# Patient Record
Sex: Male | Born: 1990 | Race: White | Hispanic: No | Marital: Single | Smoking: Never smoker
Health system: Southern US, Community
[De-identification: ages and names within clinical notes are randomized; demographics above are authoritative.]

## PROBLEM LIST (undated history)

## (undated) HISTORY — PX: SHOULDER ARTHROSCOPY: SHX128

---
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
## 2012-08-25 ENCOUNTER — Emergency Department: Payer: Self-pay | Admitting: Emergency Medicine

## 2013-11-12 ENCOUNTER — Encounter: Payer: Self-pay | Admitting: Podiatry

## 2013-11-12 ENCOUNTER — Ambulatory Visit (INDEPENDENT_AMBULATORY_CARE_PROVIDER_SITE_OTHER): Payer: BC Managed Care – PPO | Admitting: Podiatry

## 2013-11-12 VITALS — BP 139/83 | HR 59 | Resp 16 | Ht 71.0 in | Wt 186.0 lb

## 2013-11-12 DIAGNOSIS — L600 Ingrowing nail: Secondary | ICD-10-CM

## 2013-11-12 NOTE — Progress Notes (Signed)
   Subjective:    Patient ID: Nicholas Chase, male    DOB: [birth year], 23 y.o.   MRN: [id number]  HPI Comments: "I have an ingrown"  Patient c/o tender 1st toe left, lateral border, since May 28th. The area is red, swollen, and has been draining. He is currently on Augmentin and has been soaking and cleaning with peroxide. Some better.  Toe Pain       Review of Systems  Skin: Positive for rash.  All other systems reviewed and are negative.      Objective:   Physical Exam        Assessment & Plan:

## 2013-11-12 NOTE — Patient Instructions (Addendum)

## 2013-11-13 ENCOUNTER — Telehealth: Payer: Self-pay | Admitting: *Deleted

## 2013-11-13 NOTE — Progress Notes (Signed)
Subjective:     Patient ID: Nicholas Chase, male   DOB: 15-Mar-1991, 23 y.o.   MRN: [id number]  Toe Pain    patient presents with mother stating that I haven't ingrown toenail it's been really bothering me and I tried to trim it soak it without relief and it had some drainage   Review of Systems  All other systems reviewed and are negative.      Objective:   Physical Exam  Nursing note and vitals reviewed. Constitutional: He is oriented to person, place, and time.  Cardiovascular: Intact distal pulses.   Musculoskeletal: Normal range of motion.  Neurological: He is oriented to person, place, and time.  Skin: Skin is warm.   neurovascular status is found to be intact with range of motion adequate subtalar midtarsal joint and muscle strength within normal limits. Patient's found to have on the left hallux lateral border pain when pressed with an incurvated quarter that has some slight distal redness but no drainage as patient currently is on antibiotic from family physician     Assessment:     Ingrown toenail left hallux with no indications of current infection    Plan:     H&P and condition explained the patient. I recommended removal of the corner and I explained the procedure and risk. Patient wants surgery and today I infiltrated 60 mg Xylocaine Marcaine mixture remove the lateral quarter exposed matrix and applied phenol 3 applications 30 seconds followed by alcohol lavaged and sterile dressing. Instructed on soaks and reappoint

## 2013-11-13 NOTE — Telephone Encounter (Signed)
I had an ingrown toenail done yesterday.  You prescribed Triple Antibiotic Ointment.  I'm allergic to sulfate.  This kit has it in there.  Is there something else I can use to treat it?  Patient called again.  I informed him to use betadine ointment instead of the triple antibiotic ointment.  He asked if that comes in the kit.  I told him no.  He said so I have to go out and buy some.  I told him yes.

## 2016-06-28 ENCOUNTER — Encounter: Payer: Self-pay | Admitting: Emergency Medicine

## 2016-06-28 ENCOUNTER — Emergency Department
Admission: EM | Admit: 2016-06-28 | Discharge: 2016-06-28 | Disposition: A | Discharge status: Home / Self Care | Payer: Self-pay | Attending: Emergency Medicine | Admitting: Emergency Medicine

## 2016-06-28 ENCOUNTER — Emergency Department: Payer: Self-pay

## 2016-06-28 DIAGNOSIS — F1290 Cannabis use, unspecified, uncomplicated: Secondary | ICD-10-CM | POA: Insufficient documentation

## 2016-06-28 DIAGNOSIS — N201 Calculus of ureter: Secondary | ICD-10-CM | POA: Insufficient documentation

## 2016-06-28 DIAGNOSIS — R109 Unspecified abdominal pain: Secondary | ICD-10-CM

## 2016-06-28 LAB — URINALYSIS, COMPLETE (UACMP) WITH MICROSCOPIC
BACTERIA UA: NONE SEEN
BILIRUBIN URINE: NEGATIVE
Glucose, UA: 50 mg/dL — AB
Ketones, ur: 20 mg/dL — AB
Leukocytes, UA: NEGATIVE
NITRITE: NEGATIVE
PROTEIN: NEGATIVE mg/dL
Specific Gravity, Urine: 1.024 (ref 1.005–1.030)
Squamous Epithelial / LPF: NONE SEEN
pH: 5 (ref 5.0–8.0)

## 2016-06-28 LAB — CBC
HEMATOCRIT: 40.8 % (ref 40.0–52.0)
HEMOGLOBIN: 14.6 g/dL (ref 13.0–18.0)
MCH: 31.2 pg (ref 26.0–34.0)
MCHC: 35.9 g/dL (ref 32.0–36.0)
MCV: 86.9 fL (ref 80.0–100.0)
Platelets: 251 10*3/uL (ref 150–440)
RBC: 4.7 MIL/uL (ref 4.40–5.90)
RDW: 12.8 % (ref 11.5–14.5)
WBC: 10.2 10*3/uL (ref 3.8–10.6)

## 2016-06-28 LAB — COMPREHENSIVE METABOLIC PANEL
ALBUMIN: 4.7 g/dL (ref 3.5–5.0)
ALT: 21 U/L (ref 17–63)
ANION GAP: 10 (ref 5–15)
AST: 27 U/L (ref 15–41)
Alkaline Phosphatase: 63 U/L (ref 38–126)
BILIRUBIN TOTAL: 1.4 mg/dL — AB (ref 0.3–1.2)
BUN: 21 mg/dL — ABNORMAL HIGH (ref 6–20)
CHLORIDE: 104 mmol/L (ref 101–111)
CO2: 22 mmol/L (ref 22–32)
Calcium: 9.5 mg/dL (ref 8.9–10.3)
Creatinine, Ser: 1.17 mg/dL (ref 0.61–1.24)
GFR calc Af Amer: >60 mL/min (ref >60)
GFR calc non Af Amer: >60 mL/min (ref >60)
GLUCOSE: 141 mg/dL — AB (ref 65–99)
POTASSIUM: 3.6 mmol/L (ref 3.5–5.1)
SODIUM: 136 mmol/L (ref 135–145)
TOTAL PROTEIN: 7.8 g/dL (ref 6.5–8.1)

## 2016-06-28 LAB — LIPASE, BLOOD: LIPASE: 17 U/L (ref 11–51)

## 2016-06-28 MED ORDER — HYDROCODONE-ACETAMINOPHEN 5-325 MG PO TABS: 1.0000 | ORAL_TABLET | ORAL | 0 refills | Status: DC | PRN

## 2016-06-28 MED ORDER — NAPROXEN 500 MG PO TABS: 500.0000 mg | ORAL_TABLET | Freq: Two times a day (BID) | ORAL | 2 refills | Status: DC

## 2016-06-28 MED ORDER — HYDROMORPHONE HCL 1 MG/ML IJ SOLN
0.5000 mg | Freq: Once | INTRAMUSCULAR | Status: AC
Start: 2016-06-28 — End: 2016-06-28
  Administered 2016-06-28: 0.5 mg via INTRAVENOUS
  Filled 2016-06-28: qty 1

## 2016-06-28 MED ORDER — HYDROMORPHONE HCL 1 MG/ML IJ SOLN
0.5000 mg | Freq: Once | INTRAMUSCULAR | Status: AC
  Administered 2016-06-28: 0.5 mg via INTRAVENOUS

## 2016-06-28 MED ORDER — HYDROMORPHONE HCL 1 MG/ML IJ SOLN
INTRAMUSCULAR | Status: AC
  Administered 2016-06-28: 0.5 mg via INTRAVENOUS
  Filled 2016-06-28: qty 1

## 2016-06-28 MED ORDER — KETOROLAC TROMETHAMINE 30 MG/ML IJ SOLN
INTRAMUSCULAR | Status: AC
  Administered 2016-06-28: 30 mg via INTRAVENOUS
  Filled 2016-06-28: qty 1

## 2016-06-28 MED ORDER — ONDANSETRON HCL 4 MG PO TABS: 4.0000 mg | ORAL_TABLET | Freq: Three times a day (TID) | ORAL | 1 refills | Status: DC | PRN

## 2016-06-28 MED ORDER — ONDANSETRON HCL 4 MG/2ML IJ SOLN
INTRAMUSCULAR | Status: AC
  Administered 2016-06-28: 4 mg via INTRAVENOUS
  Filled 2016-06-28: qty 2

## 2016-06-28 MED ORDER — ONDANSETRON HCL 4 MG/2ML IJ SOLN
4.0000 mg | Freq: Once | INTRAMUSCULAR | Status: AC | PRN
  Administered 2016-06-28: 4 mg via INTRAVENOUS

## 2016-06-28 MED ORDER — ONDANSETRON HCL 4 MG PO TABS: 4.0000 mg | ORAL_TABLET | Freq: Every day | ORAL | 1 refills | Status: DC | PRN

## 2016-06-28 MED ORDER — KETOROLAC TROMETHAMINE 30 MG/ML IJ SOLN
30.0000 mg | Freq: Once | INTRAMUSCULAR | Status: AC
  Administered 2016-06-28: 30 mg via INTRAVENOUS

## 2016-06-28 MED ORDER — SODIUM CHLORIDE 0.9 % IV BOLUS (SEPSIS)
1000.0000 mL | Freq: Once | INTRAVENOUS | Status: AC
Start: 2016-06-28 — End: 2016-06-28
  Administered 2016-06-28: 1000 mL via INTRAVENOUS

## 2016-06-28 NOTE — ED Triage Notes (Signed)
Patient with complaint of right flank pain, nausea and vomiting about 2 hours ago.

## 2016-06-28 NOTE — ED Provider Notes (Signed)
[facility] Emergency Department Provider Note   ____________________________________________    I have reviewed the triage vital signs and the nursing notes.   HISTORY  Chief Complaint Flank Pain and Emesis     HPI Nicholas Chase is a 26 y.o. male who presents with complaints of right flank pain. Patient reports the pain is moderate to severe and cramping in nature. He denies fever. Mild nausea no vomiting. No history of the same. No history of abdominal surgery. Patient reports the pain started relatively abruptly overnight. Denies hematuria.   History reviewed. No pertinent past medical history.  There are no active problems to display for this patient.   Past Surgical History:  Procedure Laterality Date  . SHOULDER ARTHROSCOPY      Prior to Admission medications   Medication Sig Start Date End Date Taking? Authorizing Provider  amoxicillin-clavulanate (AUGMENTIN) 500-125 MG per tablet Take 1 tablet by mouth 3 (three) times daily.    Historical Provider, MD  HYDROcodone-acetaminophen (NORCO/VICODIN) 5-325 MG tablet Take 1 tablet by mouth every 4 (four) hours as needed for moderate pain. 06/28/16   Jene Everyobert Ladeana Laplant, MD  IBUPROFEN PO Take by mouth.    Historical Provider, MD  naproxen (NAPROSYN) 500 MG tablet Take 1 tablet (500 mg total) by mouth 2 (two) times daily with a meal. 06/28/16   Jene Everyobert Aziah Brostrom, MD  ondansetron (ZOFRAN) 4 MG tablet Take 1 tablet (4 mg total) by mouth every 8 (eight) hours as needed for nausea or vomiting. 06/28/16   Jene Everyobert Arcadia Gorgas, MD     Allergies Sulfa antibiotics  No family history on file.  Social History Social History  Substance Use Topics  . Smoking status: Never Smoker  . Smokeless tobacco: Never Used  . Alcohol use No    Review of Systems  Constitutional: No fever/chills   Cardiovascular: Denies chest pain. Respiratory: Denies shortness of breath. Gastrointestinal: As above   Genitourinary:  Negative for hematuria Musculoskeletal: Negative for back pain.    10-point ROS otherwise negative.  ____________________________________________   PHYSICAL EXAM:  VITAL SIGNS: ED Triage Vitals  Enc Vitals Group     BP 06/28/16 0526 134/74     Pulse Rate 06/28/16 0526 69     Resp 06/28/16 0526 18     Temp 06/28/16 0526 97.4 F (36.3 C)     Temp Source 06/28/16 0526 Oral     SpO2 06/28/16 0526 100 %     Weight 06/28/16 0526 175 lb (79.4 kg)     Height 06/28/16 0526 6' (1.829 m)     Head Circumference --      Peak Flow --      Pain Score 06/28/16 0527 8     Pain Loc --      Pain Edu? --      Excl. in GC? --     Constitutional: Alert and oriented. No acute distress. Pleasant and interactive Eyes: Conjunctivae are normal.   Mouth/Throat: Mucous membranes are moist.    Cardiovascular: Normal rate, regular rhythm. Grossly normal heart sounds.  Good peripheral circulation. Respiratory: Normal respiratory effort.  No retractions. Lungs CTAB. Gastrointestinal: Right lower quadrant tenderness. No distention.  No CVA tenderness.  Musculoskeletal: No lower extremity tenderness nor edema.  Warm and well perfused Neurologic:  Normal speech and language. No gross focal neurologic deficits are appreciated.  Skin:  Skin is warm, dry and intact. No rash noted. Psychiatric: Mood and affect are normal. Speech and behavior are normal.  ____________________________________________   LABS (all labs ordered are listed, but only abnormal results are displayed)  Labs Reviewed  COMPREHENSIVE METABOLIC PANEL - Abnormal; Notable for the following:       Result Value   Glucose, Bld 141 (*)    BUN 21 (*)    Total Bilirubin 1.4 (*)    All other components within normal limits  URINALYSIS, COMPLETE (UACMP) WITH MICROSCOPIC - Abnormal; Notable for the following:    Color, Urine YELLOW (*)    APPearance CLEAR (*)    Glucose, UA 50 (*)    Hgb urine dipstick LARGE (*)    Ketones, ur 20 (*)     All other components within normal limits  CBC  LIPASE, BLOOD   ____________________________________________  EKG  None ____________________________________________  RADIOLOGY  CT renal stone study Demonstrates right 3 mm UVJ stone ____________________________________________   PROCEDURES  Procedure(s) performed: No    Critical Care performed: No ____________________________________________   INITIAL IMPRESSION / ASSESSMENT AND PLAN / ED COURSE  Pertinent labs & imaging results that were available during my care of the patient were reviewed by me and considered in my medical decision making (see chart for details).  Patient presented with complaints of flank pain. Urinalysis pending. Suspicious for ureterolithiasis although appendicitis is also on the differential. Lab work is reassuring. IV analgesics  Clinical Course as of Jun 28 1106  Mon Jun 28, 2016  0849 3 mm uvj stone, pending urinalysis   [RK]    Clinical Course User Index [RK] Jene Every, MD  Patient's pain is relieved after Toradol. Urinalysis is reassuring. We will treat with pain medications and discharge the patient ____________________________________________   FINAL CLINICAL IMPRESSION(S) / ED DIAGNOSES  Final diagnoses:  Right flank pain  Ureterolithiasis      NEW MEDICATIONS STARTED DURING THIS VISIT:  New Prescriptions   HYDROCODONE-ACETAMINOPHEN (NORCO/VICODIN) 5-325 MG TABLET    Take 1 tablet by mouth every 4 (four) hours as needed for moderate pain.   NAPROXEN (NAPROSYN) 500 MG TABLET    Take 1 tablet (500 mg total) by mouth 2 (two) times daily with a meal.   ONDANSETRON (ZOFRAN) 4 MG TABLET    Take 1 tablet (4 mg total) by mouth every 8 (eight) hours as needed for nausea or vomiting.     Note:  This document was prepared using Dragon voice recognition software and may include unintentional dictation errors.    Jene Every, MD 06/28/16 [phone]

## 2018-02-04 DIAGNOSIS — R112 Nausea with vomiting, unspecified: Secondary | ICD-10-CM | POA: Diagnosis not present

## 2018-02-04 DIAGNOSIS — B349 Viral infection, unspecified: Secondary | ICD-10-CM | POA: Diagnosis not present

## 2018-02-05 ENCOUNTER — Encounter: Payer: Self-pay | Admitting: Emergency Medicine

## 2018-02-05 ENCOUNTER — Emergency Department: Payer: BLUE CROSS/BLUE SHIELD

## 2018-02-05 ENCOUNTER — Emergency Department
Admission: EM | Admit: 2018-02-05 | Discharge: 2018-02-06 | Disposition: A | Discharge status: Home / Self Care | Payer: BLUE CROSS/BLUE SHIELD | Attending: Emergency Medicine | Admitting: Emergency Medicine

## 2018-02-05 DIAGNOSIS — Z79899 Other long term (current) drug therapy: Secondary | ICD-10-CM | POA: Insufficient documentation

## 2018-02-05 DIAGNOSIS — R51 Headache: Secondary | ICD-10-CM | POA: Diagnosis not present

## 2018-02-05 DIAGNOSIS — J189 Pneumonia, unspecified organism: Secondary | ICD-10-CM | POA: Diagnosis not present

## 2018-02-05 DIAGNOSIS — R05 Cough: Secondary | ICD-10-CM | POA: Diagnosis not present

## 2018-02-05 LAB — CSF CELL COUNT WITH DIFFERENTIAL
EOS CSF: 0 %
EOS CSF: 0 %
LYMPHS CSF: 50 %
LYMPHS CSF: 73 %
MONOCYTE-MACROPHAGE-SPINAL FLUID: 27 %
MONOCYTE-MACROPHAGE-SPINAL FLUID: 50 %
OTHER CELLS CSF: 0
OTHER CELLS CSF: 0
RBC COUNT CSF: 0 /mm3 (ref 0–3)
RBC Count, CSF: 24 /mm3 — ABNORMAL HIGH (ref 0–3)
Segmented Neutrophils-CSF: 0 %
Segmented Neutrophils-CSF: 0 %
TUBE #: 1
TUBE #: 4
WBC, CSF: 2 /mm3 (ref 0–5)
WBC, CSF: 2 /mm3 (ref 0–5)

## 2018-02-05 LAB — COMPREHENSIVE METABOLIC PANEL
ALBUMIN: 3.9 g/dL (ref 3.5–5.0)
ALT: 29 U/L (ref 0–44)
AST: 32 U/L (ref 15–41)
Alkaline Phosphatase: 57 U/L (ref 38–126)
Anion gap: 7 (ref 5–15)
BILIRUBIN TOTAL: 1.6 mg/dL — AB (ref 0.3–1.2)
BUN: 13 mg/dL (ref 6–20)
CALCIUM: 9.2 mg/dL (ref 8.9–10.3)
CO2: 28 mmol/L (ref 22–32)
CREATININE: 1.07 mg/dL (ref 0.61–1.24)
Chloride: 103 mmol/L (ref 98–111)
GFR calc Af Amer: >60 mL/min (ref >60)
GFR calc non Af Amer: >60 mL/min (ref >60)
GLUCOSE: 114 mg/dL — AB (ref 70–99)
Potassium: 3.5 mmol/L (ref 3.5–5.1)
SODIUM: 138 mmol/L (ref 135–145)
Total Protein: 8 g/dL (ref 6.5–8.1)

## 2018-02-05 LAB — CBC WITH DIFFERENTIAL/PLATELET
BASOS PCT: 0 %
Basophils Absolute: 0 10*3/uL (ref 0–0.1)
EOS ABS: 0 10*3/uL (ref 0–0.7)
EOS PCT: 0 %
HCT: 39.4 % — ABNORMAL LOW (ref 40.0–52.0)
Hemoglobin: 14.1 g/dL (ref 13.0–18.0)
Lymphocytes Relative: 5 %
Lymphs Abs: 0.7 10*3/uL — ABNORMAL LOW (ref 1.0–3.6)
MCH: 31.8 pg (ref 26.0–34.0)
MCHC: 35.7 g/dL (ref 32.0–36.0)
MCV: 88.9 fL (ref 80.0–100.0)
MONO ABS: 0.3 10*3/uL (ref 0.2–1.0)
MONOS PCT: 3 %
NEUTROS ABS: 12.2 10*3/uL — AB (ref 1.4–6.5)
Neutrophils Relative %: 92 %
PLATELETS: 301 10*3/uL (ref 150–440)
RBC: 4.43 MIL/uL (ref 4.40–5.90)
RDW: 12 % (ref 11.5–14.5)
WBC: 13.3 10*3/uL — ABNORMAL HIGH (ref 3.8–10.6)

## 2018-02-05 LAB — MONONUCLEOSIS SCREEN: Mono Screen: NEGATIVE

## 2018-02-05 LAB — PROTEIN AND GLUCOSE, CSF
Glucose, CSF: 66 mg/dL (ref 40–70)
TOTAL PROTEIN, CSF: 20 mg/dL (ref 15–45)

## 2018-02-05 MED ORDER — SODIUM CHLORIDE 0.9 % IV BOLUS
1000.0000 mL | Freq: Once | INTRAVENOUS | Status: AC
  Administered 2018-02-05: 1000 mL via INTRAVENOUS

## 2018-02-05 MED ORDER — ONDANSETRON HCL 4 MG/2ML IJ SOLN
4.0000 mg | Freq: Once | INTRAMUSCULAR | Status: AC
  Administered 2018-02-05: 4 mg via INTRAVENOUS
  Filled 2018-02-05: qty 2

## 2018-02-05 MED ORDER — AZITHROMYCIN 500 MG PO TABS
500.0000 mg | ORAL_TABLET | Freq: Once | ORAL | Status: AC
  Administered 2018-02-06: 500 mg via ORAL
  Filled 2018-02-05: qty 1

## 2018-02-05 MED ORDER — LIDOCAINE HCL (PF) 1 % IJ SOLN
INTRAMUSCULAR | Status: AC
  Administered 2018-02-05: 5 mL
  Filled 2018-02-05: qty 5

## 2018-02-05 MED ORDER — AZITHROMYCIN 250 MG PO TABS: ORAL_TABLET | ORAL | 0 refills | Status: AC

## 2018-02-05 MED ORDER — CEFTRIAXONE SODIUM 2 G IJ SOLR
2.0000 g | Freq: Once | INTRAMUSCULAR | Status: AC
  Administered 2018-02-05: 2 g via INTRAVENOUS
  Filled 2018-02-05: qty 20

## 2018-02-05 MED ORDER — LIDOCAINE HCL (PF) 1 % IJ SOLN
5.0000 mL | Freq: Once | INTRAMUSCULAR | Status: AC
  Administered 2018-02-05: 5 mL
  Filled 2018-02-05 (×2): qty 5

## 2018-02-05 MED ORDER — VANCOMYCIN HCL 10 G IV SOLR
1500.0000 mg | Freq: Once | INTRAVENOUS | Status: DC
  Filled 2018-02-05: qty 1500

## 2018-02-05 MED ORDER — MORPHINE SULFATE (PF) 4 MG/ML IV SOLN
4.0000 mg | Freq: Once | INTRAVENOUS | Status: AC
  Administered 2018-02-05: 4 mg via INTRAVENOUS
  Filled 2018-02-05: qty 1

## 2018-02-05 NOTE — ED Provider Notes (Addendum)
[facility] Emergency Department Provider Note  ____________________________________________  Time seen: Approximately 9:49 PM  I have reviewed the triage vital signs and the nursing notes.   HISTORY  Chief Complaint Fever; Headache; and Neck Pain    HPI Nicholas Chase is a 27 y.o. male complaining of headache neck pain and fever for the past 3 to 4 days, gradual onset.  Also has sweats and chills.  Headache is generalized, worse with change in position and neck movement.  Feels somewhat similar to headaches he said in the past but much more severe than his ever had.  No recent illnesses.  No past medical history.  Pain is constant, waxing and waning, severe.  Worse with lights.  No alleviating factors.      History reviewed. No pertinent past medical history.   There are no active problems to display for this patient.    Past Surgical History:  Procedure Laterality Date  . SHOULDER ARTHROSCOPY       Prior to Admission medications   Medication Sig Start Date End Date Taking? Authorizing Provider  azithromycin (ZITHROMAX Z-PAK) 250 MG tablet Take 2 tablets (500 mg) on  Day 1,  followed by 1 tablet (250 mg) once daily on Days 2 through 5. 02/05/18   Sharman Cheek, MD     Allergies Sulfa antibiotics   No family history on file.  Social History Social History   Tobacco Use  . Smoking status: Never Smoker  . Smokeless tobacco: Never Used  Substance Use Topics  . Alcohol use: No  . Drug use: Yes    Types: Marijuana    Review of Systems  Constitutional: Positive fever and chills.  ENT:   No sore throat. No rhinorrhea. Cardiovascular:   No chest pain or syncope. Respiratory: Positive shortness of breath and nonproductive cough. Gastrointestinal:   Negative for abdominal pain, vomiting and diarrhea.  Musculoskeletal:   Negative for focal pain or swelling All other systems reviewed and are negative except as documented above in ROS  and HPI.  ____________________________________________   PHYSICAL EXAM:  VITAL SIGNS: ED Triage Vitals  Enc Vitals Group     BP 02/05/18 1950 (!) 144/83     Pulse Rate 02/05/18 1950 100     Resp 02/05/18 1950 (!) 30     Temp 02/05/18 1950 98.2 F (36.8 C)     Temp Source 02/05/18 1950 Oral     SpO2 02/05/18 1950 90 %     Weight 02/05/18 1953 205 lb (93 kg)     Height 02/05/18 1953 6' (1.829 m)     Head Circumference --      Peak Flow --      Pain Score 02/05/18 1953 7     Pain Loc --      Pain Edu? --      Excl. in GC? --     Vital signs reviewed, nursing assessments reviewed.   Constitutional:   Alert and oriented. Non-toxic appearance. Eyes:   Conjunctivae are normal. EOMI. PERRL.  Positive photophobia ENT      Head:   Normocephalic and atraumatic.      Nose:   No congestion/rhinnorhea.       Mouth/Throat:   MMM, no pharyngeal erythema. No peritonsillar mass.       Neck:   No meningismus. Full ROM. Hematological/Lymphatic/Immunilogical:   No cervical lymphadenopathy. Cardiovascular:   RRR. Symmetric bilateral radial and DP pulses.  No murmurs. Cap refill less than 2 seconds.  Respiratory:   Mild tachypnea.  Coarse breath sounds diffusely.  Inducible wheezing when coughing or exhaling quickly.  Good air entry in all lung fields.  Oxygen saturation about 94%. Gastrointestinal:   Soft and nontender. Non distended. There is no CVA tenderness.  No rebound, rigidity, or guarding. Musculoskeletal:   Normal range of motion in all extremities. No joint effusions.  No lower extremity tenderness.  No edema. Neurologic:   Normal speech and language.  Motor grossly intact. No nuchal rigidity Cranial nerves III through XII intact No acute focal neurologic deficits are appreciated.  Skin:    Skin is warm, dry and intact. No rash noted.  No petechiae, purpura, or bullae.  ____________________________________________    LABS (pertinent positives/negatives) (all labs ordered are  listed, but only abnormal results are displayed) Labs Reviewed  COMPREHENSIVE METABOLIC PANEL - Abnormal; Notable for the following components:      Result Value   Glucose, Bld 114 (*)    Total Bilirubin 1.6 (*)    All other components within normal limits  CBC WITH DIFFERENTIAL/PLATELET - Abnormal; Notable for the following components:   WBC 13.3 (*)    HCT 39.4 (*)    Neutro Abs 12.2 (*)    Lymphs Abs 0.7 (*)    All other components within normal limits  CSF CELL COUNT WITH DIFFERENTIAL - Abnormal; Notable for the following components:   RBC Count, CSF 24 (*)    All other components within normal limits  CSF CULTURE  MONONUCLEOSIS SCREEN  CSF CELL COUNT WITH DIFFERENTIAL  PROTEIN AND GLUCOSE, CSF  HERPES SIMPLEX VIRUS(HSV) DNA BY PCR   ____________________________________________   EKG    ____________________________________________    RADIOLOGY  Ct Head Wo Contrast  Result Date: 02/05/2018 CLINICAL DATA:  Headache and neck pain with fever EXAM: CT HEAD WITHOUT CONTRAST TECHNIQUE: Contiguous axial images were obtained from the base of the skull through the vertex without intravenous contrast. COMPARISON:  None. FINDINGS: BRAIN: The ventricles and sulci are normal. No intraparenchymal hemorrhage, mass effect nor midline shift. No acute large vascular territory infarcts. Grey-white matter distinction is maintained. No hydrocephalus. The basal ganglia are unremarkable. No abnormal extra-axial fluid collections. Basal cisterns are not effaced and midline. The brainstem and cerebellar hemispheres are without acute abnormalities. VASCULAR: No hyperdense vessel sign. No definite aneurysm seen on this unenhanced study. No unexpected calcifications. SKULL/SOFT TISSUES: No skull fracture. No significant soft tissue swelling. ORBITS/SINUSES: The included ocular globes and orbital contents are normal.The mastoid air cells are clear. The included paranasal sinuses are well-aerated. OTHER:  None. IMPRESSION: Normal head CT. Electronically Signed   By: Tollie Eth M.D.   On: 02/05/2018 21:08   Dg Chest Portable 1 View  Result Date: 02/05/2018 CLINICAL DATA:  Cough and fever. Headache with nausea and vomiting and loose stool. EXAM: PORTABLE CHEST 1 VIEW COMPARISON:  None. FINDINGS: Hazy opacity asymmetric to the left. No effusion or pneumothorax. Normal heart size and mediastinal contours. IMPRESSION: Left more than right lung opacity compatible with pneumonia in this setting. The opacities are hazy, consider atypical pathogens. Electronically Signed   By: Marnee Spring M.D.   On: 02/05/2018 21:27    ____________________________________________   PROCEDURES .Lumbar Puncture Date/Time: 02/21/2018 3:25 PM Performed by: Sharman Cheek, MD Authorized by: Sharman Cheek, MD   Consent:    Consent obtained:  Verbal and written   Consent given by:  Patient   Risks discussed:  Headache, bleeding, infection, pain and nerve damage   Alternatives  discussed:  Alternative treatment Pre-procedure details:    Procedure purpose:  Diagnostic   Preparation: Patient was prepped and draped in usual sterile fashion   Sedation:    Sedation type:  Anxiolysis Anesthesia (see MAR for exact dosages):    Anesthesia method:  Local infiltration   Local anesthetic:  Lidocaine 1% w/o epi Procedure details:    Lumbar space:  L4-L5 interspace   Patient position:  L lateral decubitus   Needle gauge:  20   Needle type:  Spinal needle - Quincke tip   Needle length (in):  3.5   Ultrasound guidance: no     Number of attempts:  1   Opening pressure (cm H2O):  23   Fluid appearance:  Clear   Tubes of fluid:  4   Total volume (ml):  10 Post-procedure:    Puncture site:  Adhesive bandage applied and direct pressure applied   Patient tolerance of procedure:  Tolerated well, no immediate complications    ____________________________________________  DIFFERENTIAL DIAGNOSIS   Bronchitis,  mononucleosis, viral syndrome, meningitis  CLINICAL IMPRESSION / ASSESSMENT AND PLAN / ED COURSE  Pertinent labs & imaging results that were available during my care of the patient were reviewed by me and considered in my medical decision making (see chart for details).    Patient presents with fever chills headache neck pain.  Exam is most concerning for bronchitis or pneumonia with abnormal lung findings, patient appearing winded easily, and low normal oxygen saturation which is less than expected for a young healthy person.  Although I have a high suspicion that this is pneumonia, his symptoms localizing to the head necessitates a lumbar puncture to rule out meningitis.  He does report chronic daily headaches which are likely NSAID related but I will obtain a CT scan of the head to evaluate for tumor.  Clinical Course as of Feb 05 2341  Wynelle Link Feb 05, 2018  2147 CT head unremarkable.  Chest x-ray shows bilateral hazy opacities consistent with an atypical pneumonia.  CT Head Wo Contrast [PS]  2148 Labs show leukocytosis of 13,000.  Mono screen negative.  Due to the focality of his symptoms related to his head will proceed with lumbar puncture although work-up so far is consistent with a atypical pneumonia.  If CSF studies are reassuring patient can be managed outpatient with oral antibiotics.  WBC(!): 13.3 [PS]  2154 Waiting for supply to send LP trays   [PS]  2227 LP completed. Clear fluid. Nl OP. Cefepime finished. Will hold off on vancomycin pending CSF labs.   [PS]  2339 Gram stain and cell count negative for any white blood cells or organisms.  No meningitis, suitable for discharge home on azithromycin for pneumonia.  Gram Stain: RARE WBC SEEN RARE RED BLOOD CELLS PRESENT Performed at [facility], [address], [place], [place] [address]  [PS]    Clinical Course User Index [PS] Sharman Cheek, MD     ____________________________________________   FINAL CLINICAL  IMPRESSION(S) / ED DIAGNOSES    Final diagnoses:  Atypical pneumonia     ED Discharge Orders         Ordered    azithromycin (ZITHROMAX Z-PAK) 250 MG tablet     02/05/18 2341          Portions of this note were generated with dragon dictation software. Dictation errors may occur despite best attempts at proofreading.    Sharman Cheek, MD 02/05/18 Adin Hector    Sharman Cheek, MD 02/21/18 [phone]

## 2018-02-05 NOTE — ED Notes (Signed)
Pt noted that he has been "feeling bad" since Wed. Pt reports a HA, fever, neck soreness, N/V, and a loose stool. Pt is alert and oriented x 4. Family at bedside.

## 2018-02-05 NOTE — Discharge Instructions (Signed)
Your test today show pneumonia in both lungs.  Your tests show that you do not have meningitis.  Take antibiotics as prescribed and follow-up with your doctor this week for continued symptom monitoring.  Return to the ED if your symptoms worsen.

## 2018-02-05 NOTE — ED Notes (Signed)
Pt given a urinal.

## 2018-02-05 NOTE — ED Triage Notes (Signed)
Patient with complaint of headache, neck pain and fever since Wednesday. Patient states fever of 101.8. Patient states that he was seen at urgent care yesterday and told to follow up with his PCP.

## 2018-02-06 MED ORDER — NAPROXEN 500 MG PO TABS
250.0000 mg | ORAL_TABLET | Freq: Once | ORAL | Status: AC
  Administered 2018-02-06: 250 mg via ORAL
  Filled 2018-02-06: qty 1

## 2018-02-07 DIAGNOSIS — J189 Pneumonia, unspecified organism: Secondary | ICD-10-CM | POA: Diagnosis not present

## 2018-02-07 LAB — HERPES SIMPLEX VIRUS(HSV) DNA BY PCR
HSV 1 DNA: NEGATIVE
HSV 2 DNA: NEGATIVE

## 2018-02-09 LAB — CSF CULTURE: GRAM STAIN: NONE SEEN

## 2018-02-09 LAB — CSF CULTURE W GRAM STAIN: Culture: NO GROWTH

## 2018-02-27 ENCOUNTER — Other Ambulatory Visit: Payer: Self-pay | Admitting: Family Medicine

## 2018-02-27 ENCOUNTER — Ambulatory Visit
Admission: RE | Admit: 2018-02-27 | Discharge: 2018-02-27 | Disposition: A | Discharge status: Home / Self Care | Payer: BLUE CROSS/BLUE SHIELD | Source: Ambulatory Visit | Attending: Family Medicine | Admitting: Family Medicine

## 2018-02-27 DIAGNOSIS — J189 Pneumonia, unspecified organism: Secondary | ICD-10-CM | POA: Diagnosis not present

## 2018-06-06 DIAGNOSIS — E559 Vitamin D deficiency, unspecified: Secondary | ICD-10-CM | POA: Diagnosis not present

## 2018-06-06 DIAGNOSIS — Z114 Encounter for screening for human immunodeficiency virus [HIV]: Secondary | ICD-10-CM | POA: Diagnosis not present

## 2018-06-06 DIAGNOSIS — Z0000 Encounter for general adult medical examination without abnormal findings: Secondary | ICD-10-CM | POA: Diagnosis not present

## 2018-06-06 DIAGNOSIS — Z7251 High risk heterosexual behavior: Secondary | ICD-10-CM | POA: Diagnosis not present

## 2018-06-16 DIAGNOSIS — J069 Acute upper respiratory infection, unspecified: Secondary | ICD-10-CM | POA: Diagnosis not present

## 2018-06-20 DIAGNOSIS — E559 Vitamin D deficiency, unspecified: Secondary | ICD-10-CM | POA: Diagnosis not present

## 2018-06-20 DIAGNOSIS — G43109 Migraine with aura, not intractable, without status migrainosus: Secondary | ICD-10-CM | POA: Diagnosis not present

## 2018-06-20 DIAGNOSIS — E7800 Pure hypercholesterolemia, unspecified: Secondary | ICD-10-CM | POA: Diagnosis not present

## 2019-01-02 DIAGNOSIS — M25511 Pain in right shoulder: Secondary | ICD-10-CM | POA: Diagnosis not present

## 2019-01-02 DIAGNOSIS — G43809 Other migraine, not intractable, without status migrainosus: Secondary | ICD-10-CM | POA: Diagnosis not present

## 2019-01-10 DIAGNOSIS — K625 Hemorrhage of anus and rectum: Secondary | ICD-10-CM | POA: Diagnosis not present

## 2019-01-10 DIAGNOSIS — R197 Diarrhea, unspecified: Secondary | ICD-10-CM | POA: Diagnosis not present

## 2019-01-15 DIAGNOSIS — J069 Acute upper respiratory infection, unspecified: Secondary | ICD-10-CM | POA: Diagnosis not present

## 2019-01-16 DIAGNOSIS — J069 Acute upper respiratory infection, unspecified: Secondary | ICD-10-CM | POA: Diagnosis not present

## 2019-03-01 DIAGNOSIS — Z114 Encounter for screening for human immunodeficiency virus [HIV]: Secondary | ICD-10-CM | POA: Diagnosis not present

## 2019-03-01 DIAGNOSIS — Z7251 High risk heterosexual behavior: Secondary | ICD-10-CM | POA: Diagnosis not present

## 2019-03-01 DIAGNOSIS — Z202 Contact with and (suspected) exposure to infections with a predominantly sexual mode of transmission: Secondary | ICD-10-CM | POA: Diagnosis not present

## 2019-03-19 DIAGNOSIS — Z202 Contact with and (suspected) exposure to infections with a predominantly sexual mode of transmission: Secondary | ICD-10-CM | POA: Diagnosis not present

## 2019-06-13 DIAGNOSIS — M5442 Lumbago with sciatica, left side: Secondary | ICD-10-CM | POA: Diagnosis not present

## 2019-06-15 DIAGNOSIS — R300 Dysuria: Secondary | ICD-10-CM | POA: Diagnosis not present

## 2019-06-20 DIAGNOSIS — R05 Cough: Secondary | ICD-10-CM | POA: Diagnosis not present

## 2019-10-19 IMAGING — CT CT HEAD W/O CM
3 series · 16 of 47 positions shown, 19 images · non-contrast
Comparison: None.

CLINICAL DATA: Headache and neck pain with fever

EXAM:
CT HEAD WITHOUT CONTRAST
TECHNIQUE: Contiguous axial images were obtained from the base of the skull
through the vertex without intravenous contrast.

[Series 2: head wo · axial · 0.47mm/px · z∈[-183,-58]mm · 10 of 31 slices shown, 13 images]
[im 3/31  brain]
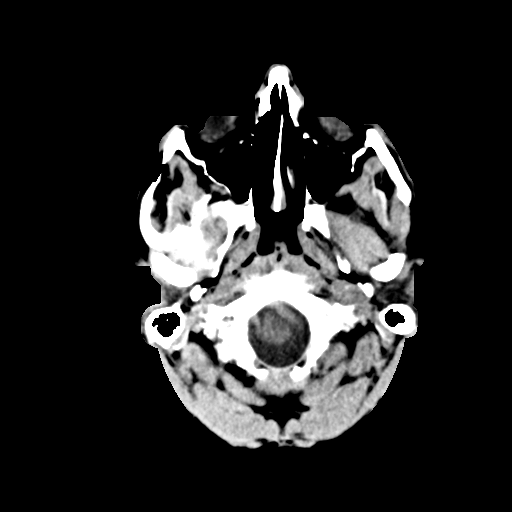
[im 3/31  bone]
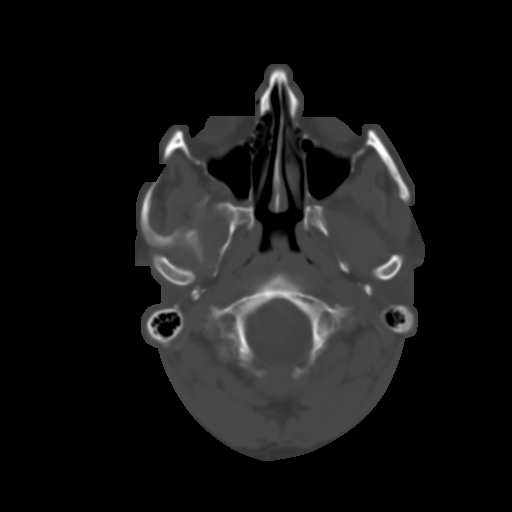
[im 6/31  brain]
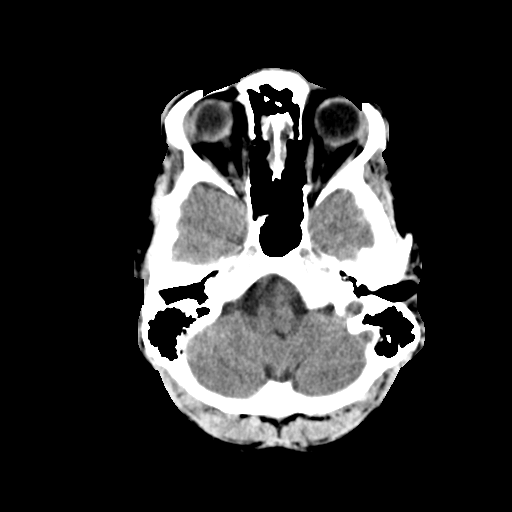
[im 9/31  brain]
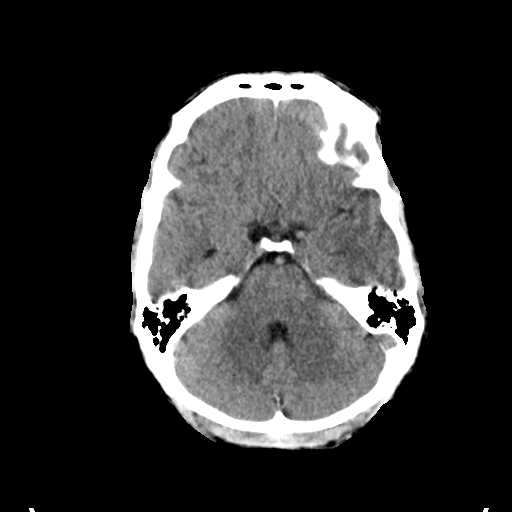
[im 11/31  brain]
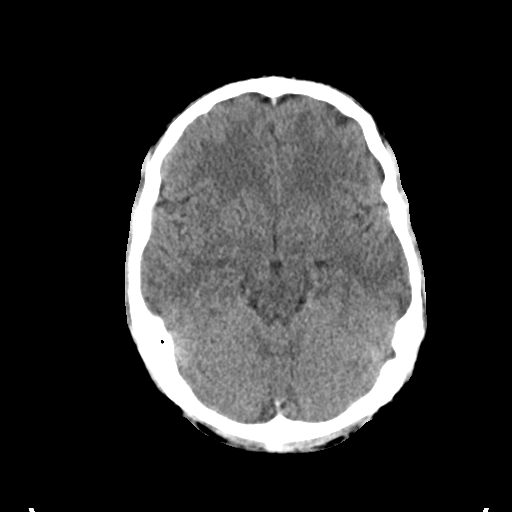
[im 14/31  brain]
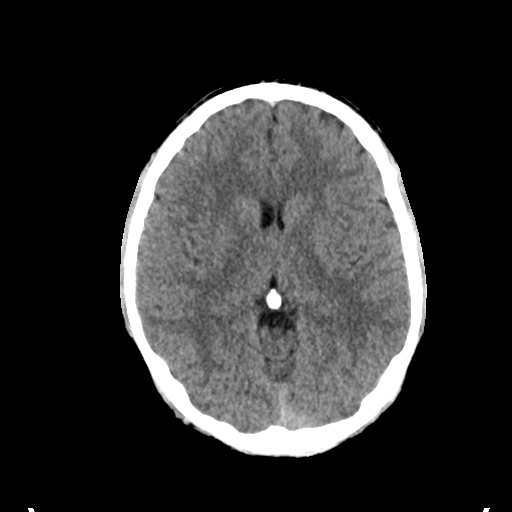
[im 14/31  bone]
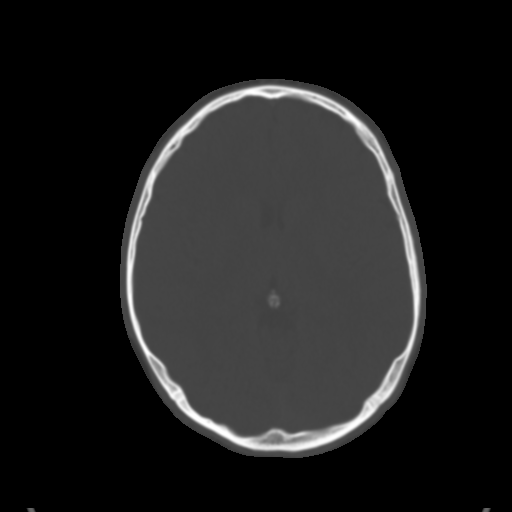
[im 17/31  brain]
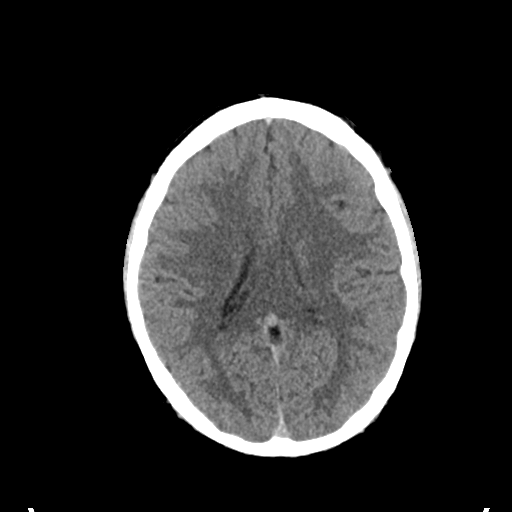
[im 20/31  brain]
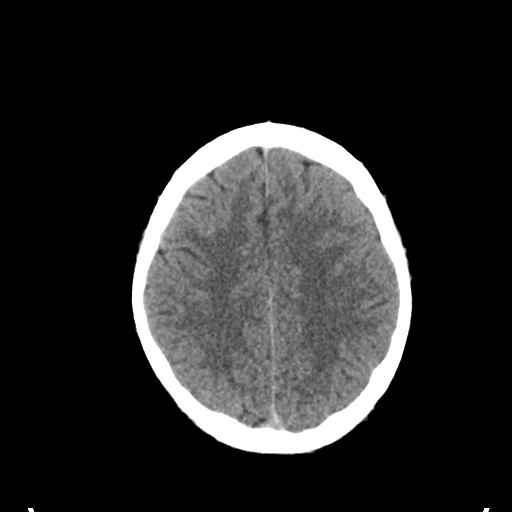
[im 23/31  brain]
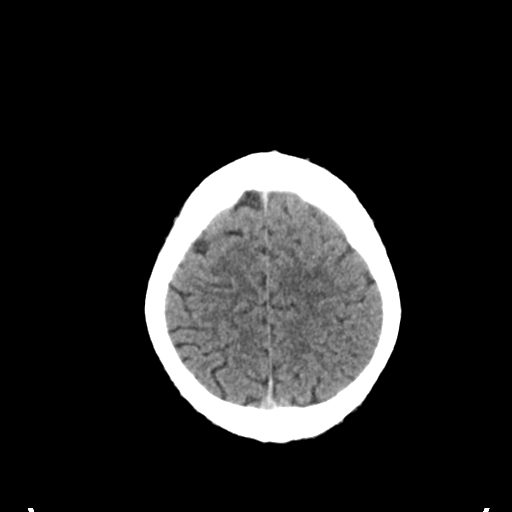
[im 25/31  brain]
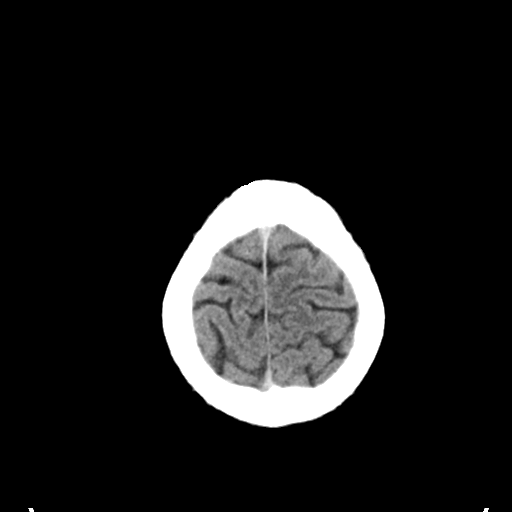
[im 25/31  bone]
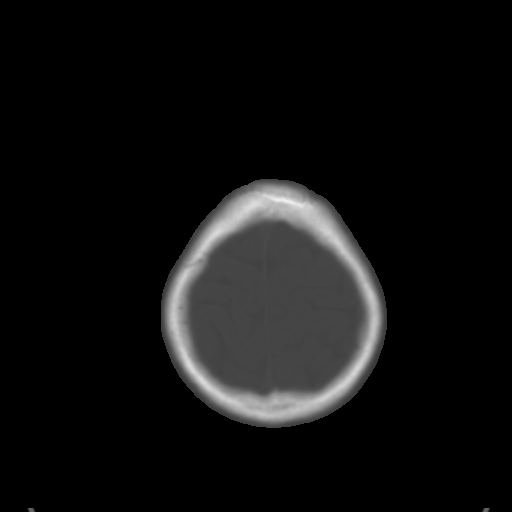
[im 28/31  brain]
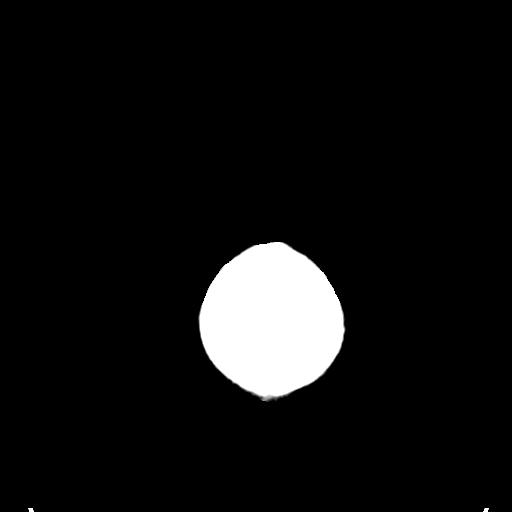

[Series 4: coronal soft tissue · coronal · 0.31mm/px · 3 of 65 slices shown]
[im 22/65  brain]
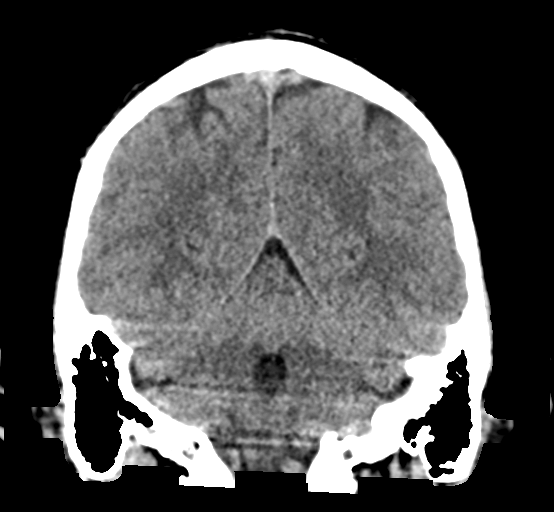
[im 29/65  brain]
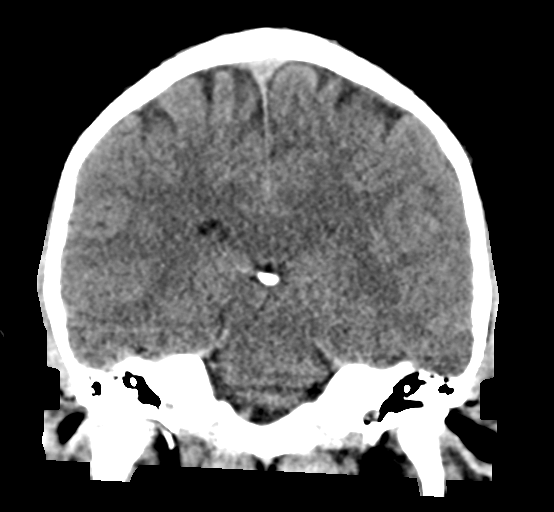
[im 36/65  brain]
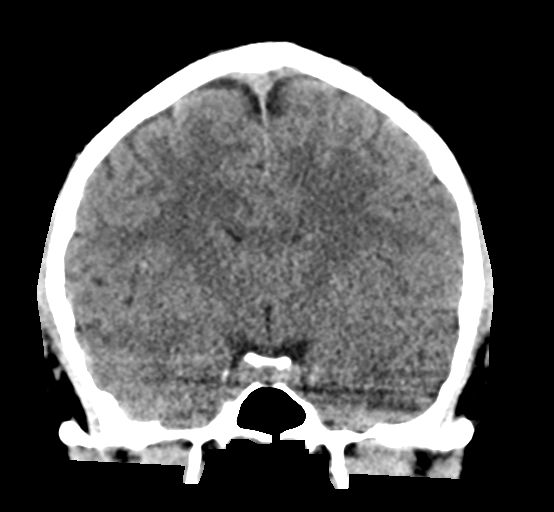

[Series 5: sagittal soft tissue · sagittal · 0.31mm/px · 3 of 53 slices shown]
[im 18/53  brain]
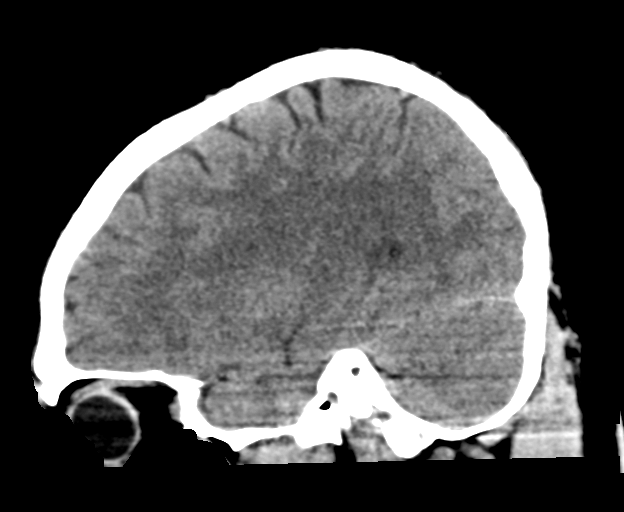
[im 27/53  brain]
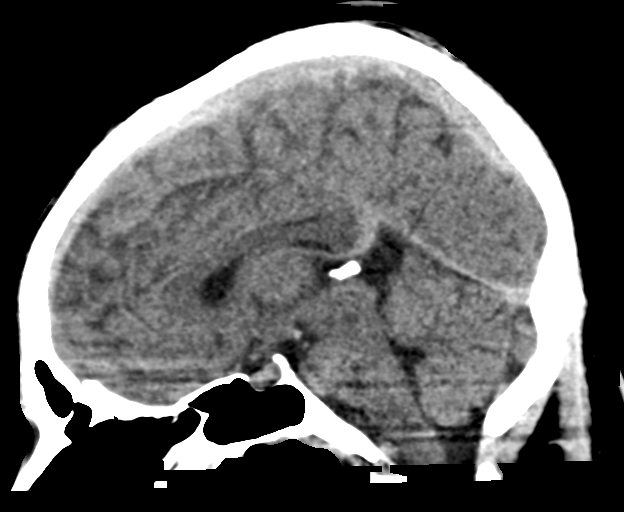
[im 35/53  brain]
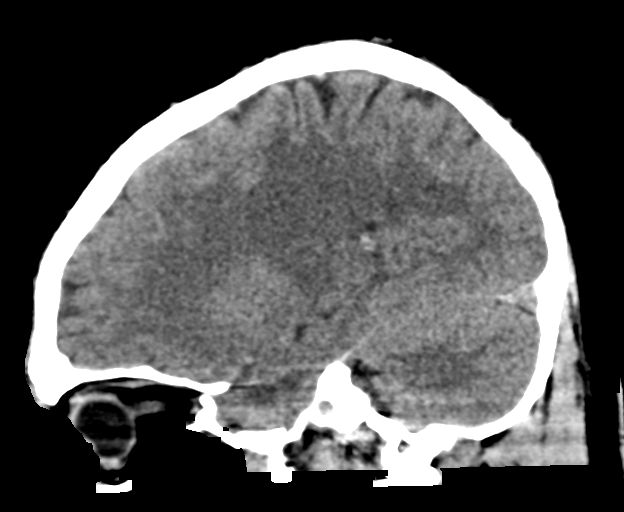

[16 of 47 positions shown; findings below may reference images not displayed]

FINDINGS: BRAIN: The ventricles and sulci are normal. No intraparenchymal
hemorrhage, mass effect nor midline shift. No acute large vascular
territory infarcts. Grey-white matter distinction is maintained. No
hydrocephalus. The basal ganglia are unremarkable. No abnormal
extra-axial fluid collections. Basal cisterns are not effaced and
midline. The brainstem and cerebellar hemispheres are without acute
abnormalities.

VASCULAR: No hyperdense vessel sign. No definite aneurysm seen on
this unenhanced study. No unexpected calcifications.

SKULL/SOFT TISSUES: No skull fracture. No significant soft tissue
swelling.

ORBITS/SINUSES: The included ocular globes and orbital contents are
normal.The mastoid air cells are clear. The included paranasal
sinuses are well-aerated.

OTHER: None.
IMPRESSION: Normal head CT.

## 2019-11-19 DIAGNOSIS — G43109 Migraine with aura, not intractable, without status migrainosus: Secondary | ICD-10-CM | POA: Diagnosis not present

## 2020-03-16 DIAGNOSIS — G43909 Migraine, unspecified, not intractable, without status migrainosus: Secondary | ICD-10-CM | POA: Diagnosis not present

## 2020-04-12 DIAGNOSIS — G43909 Migraine, unspecified, not intractable, without status migrainosus: Secondary | ICD-10-CM | POA: Diagnosis not present

## 2020-04-12 DIAGNOSIS — L309 Dermatitis, unspecified: Secondary | ICD-10-CM | POA: Diagnosis not present

## 2020-06-16 DIAGNOSIS — Z20822 Contact with and (suspected) exposure to covid-19: Secondary | ICD-10-CM | POA: Diagnosis not present

## 2020-06-18 DIAGNOSIS — Z9189 Other specified personal risk factors, not elsewhere classified: Secondary | ICD-10-CM | POA: Diagnosis not present

## 2020-06-18 DIAGNOSIS — Z20822 Contact with and (suspected) exposure to covid-19: Secondary | ICD-10-CM | POA: Diagnosis not present

## 2020-08-30 DIAGNOSIS — E559 Vitamin D deficiency, unspecified: Secondary | ICD-10-CM | POA: Diagnosis not present

## 2020-08-30 DIAGNOSIS — M545 Low back pain, unspecified: Secondary | ICD-10-CM | POA: Diagnosis not present

## 2020-08-30 DIAGNOSIS — R5383 Other fatigue: Secondary | ICD-10-CM | POA: Diagnosis not present

## 2020-08-30 DIAGNOSIS — E538 Deficiency of other specified B group vitamins: Secondary | ICD-10-CM | POA: Diagnosis not present

## 2020-08-30 DIAGNOSIS — Z0000 Encounter for general adult medical examination without abnormal findings: Secondary | ICD-10-CM | POA: Diagnosis not present

## 2020-08-30 DIAGNOSIS — Z23 Encounter for immunization: Secondary | ICD-10-CM | POA: Diagnosis not present

## 2020-10-18 DIAGNOSIS — Z131 Encounter for screening for diabetes mellitus: Secondary | ICD-10-CM | POA: Diagnosis not present

## 2020-10-18 DIAGNOSIS — E559 Vitamin D deficiency, unspecified: Secondary | ICD-10-CM | POA: Diagnosis not present

## 2021-05-02 DIAGNOSIS — E538 Deficiency of other specified B group vitamins: Secondary | ICD-10-CM | POA: Diagnosis not present

## 2021-05-02 DIAGNOSIS — E559 Vitamin D deficiency, unspecified: Secondary | ICD-10-CM | POA: Diagnosis not present

## 2021-05-02 DIAGNOSIS — Z79899 Other long term (current) drug therapy: Secondary | ICD-10-CM | POA: Diagnosis not present

## 2021-05-02 DIAGNOSIS — Z131 Encounter for screening for diabetes mellitus: Secondary | ICD-10-CM | POA: Diagnosis not present

## 2021-05-02 DIAGNOSIS — R109 Unspecified abdominal pain: Secondary | ICD-10-CM | POA: Diagnosis not present

## 2021-05-02 DIAGNOSIS — R5383 Other fatigue: Secondary | ICD-10-CM | POA: Diagnosis not present

## 2021-05-02 DIAGNOSIS — E7800 Pure hypercholesterolemia, unspecified: Secondary | ICD-10-CM | POA: Diagnosis not present

## 2021-05-27 DIAGNOSIS — R109 Unspecified abdominal pain: Secondary | ICD-10-CM | POA: Diagnosis not present

## 2021-05-27 DIAGNOSIS — R519 Headache, unspecified: Secondary | ICD-10-CM | POA: Diagnosis not present

## 2021-05-27 DIAGNOSIS — E559 Vitamin D deficiency, unspecified: Secondary | ICD-10-CM | POA: Diagnosis not present

## 2022-02-21 ENCOUNTER — Emergency Department: Payer: BC Managed Care – PPO

## 2022-02-21 ENCOUNTER — Other Ambulatory Visit: Payer: Self-pay

## 2022-02-21 DIAGNOSIS — R109 Unspecified abdominal pain: Secondary | ICD-10-CM | POA: Diagnosis present

## 2022-02-21 DIAGNOSIS — R110 Nausea: Secondary | ICD-10-CM | POA: Diagnosis not present

## 2022-02-21 DIAGNOSIS — N200 Calculus of kidney: Secondary | ICD-10-CM | POA: Insufficient documentation

## 2022-02-21 LAB — CBC
HCT: 41.6 % (ref 39.0–52.0)
Hemoglobin: 14.5 g/dL (ref 13.0–17.0)
MCH: 30.8 pg (ref 26.0–34.0)
MCHC: 34.9 g/dL (ref 30.0–36.0)
MCV: 88.3 fL (ref 80.0–100.0)
Platelets: 270 10*3/uL (ref 150–400)
RBC: 4.71 MIL/uL (ref 4.22–5.81)
RDW: 11.5 % (ref 11.5–15.5)
WBC: 6.9 10*3/uL (ref 4.0–10.5)
nRBC: 0 % (ref 0.0–0.2)

## 2022-02-21 LAB — BASIC METABOLIC PANEL
Anion gap: 8 (ref 5–15)
BUN: 12 mg/dL (ref 6–20)
CO2: 25 mmol/L (ref 22–32)
Calcium: 9.5 mg/dL (ref 8.9–10.3)
Chloride: 106 mmol/L (ref 98–111)
Creatinine, Ser: 1.11 mg/dL (ref 0.61–1.24)
GFR, Estimated: >60 mL/min (ref >60)
Glucose, Bld: 118 mg/dL — ABNORMAL HIGH (ref 70–99)
Potassium: 3.3 mmol/L — ABNORMAL LOW (ref 3.5–5.1)
Sodium: 139 mmol/L (ref 135–145)

## 2022-02-21 MED ORDER — ONDANSETRON HCL 4 MG/2ML IJ SOLN
4.0000 mg | Freq: Once | INTRAMUSCULAR | Status: AC
  Administered 2022-02-22: 4 mg via INTRAVENOUS
  Filled 2022-02-21: qty 2

## 2022-02-21 MED ORDER — FENTANYL CITRATE PF 50 MCG/ML IJ SOSY: 50.0000 ug | PREFILLED_SYRINGE | INTRAMUSCULAR | Status: DC | PRN

## 2022-02-21 NOTE — ED Triage Notes (Signed)
Pt from home c/o left flank pain and painful urination since this afternoon. Prior hx kidney stones with similar presentation. Possibly voided a little bit of blood but is not sure. Just had a colonoscopy yesterday.

## 2022-02-21 NOTE — ED Notes (Signed)
Offered to medicate pt but he states he prefers to wait until his symptoms return. Pt is currently comfortable with significant other in lobby and was instructed to ask for a nurse if he needs to be medicated while awaiting an examination room.

## 2022-02-22 ENCOUNTER — Emergency Department
Admission: EM | Admit: 2022-02-22 | Discharge: 2022-02-22 | Disposition: A | Discharge status: Home / Self Care | Payer: BC Managed Care – PPO | Attending: Emergency Medicine | Admitting: Emergency Medicine

## 2022-02-22 DIAGNOSIS — N200 Calculus of kidney: Secondary | ICD-10-CM

## 2022-02-22 LAB — CHLAMYDIA/NGC RT PCR (ARMC ONLY)
Chlamydia Tr: NOT DETECTED
N gonorrhoeae: NOT DETECTED

## 2022-02-22 LAB — URINALYSIS, ROUTINE W REFLEX MICROSCOPIC
Bacteria, UA: NONE SEEN
Bilirubin Urine: NEGATIVE
Glucose, UA: NEGATIVE mg/dL
Ketones, ur: NEGATIVE mg/dL
Leukocytes,Ua: NEGATIVE
Nitrite: NEGATIVE
Protein, ur: 30 mg/dL — AB
Specific Gravity, Urine: 1.029 (ref 1.005–1.030)
Squamous Epithelial / LPF: NONE SEEN (ref 0–5)
pH: 5 (ref 5.0–8.0)

## 2022-02-22 MED ORDER — KETOROLAC TROMETHAMINE 30 MG/ML IJ SOLN
30.0000 mg | Freq: Once | INTRAMUSCULAR | Status: AC
  Administered 2022-02-22: 30 mg via INTRAVENOUS
  Filled 2022-02-22: qty 1

## 2022-02-22 MED ORDER — OXYCODONE-ACETAMINOPHEN 5-325 MG PO TABS: 2.0000 | ORAL_TABLET | Freq: Four times a day (QID) | ORAL | 0 refills | Status: DC | PRN

## 2022-02-22 MED ORDER — OXYCODONE-ACETAMINOPHEN 5-325 MG PO TABS
1.0000 | ORAL_TABLET | ORAL | Status: DC | PRN
  Administered 2022-02-22: 1 via ORAL
  Filled 2022-02-22: qty 1

## 2022-02-22 MED ORDER — IBUPROFEN 800 MG PO TABS: 800.0000 mg | ORAL_TABLET | Freq: Three times a day (TID) | ORAL | 0 refills | Status: AC | PRN

## 2022-02-22 MED ORDER — ONDANSETRON 4 MG PO TBDP: 4.0000 mg | ORAL_TABLET | Freq: Four times a day (QID) | ORAL | 0 refills | Status: DC | PRN

## 2022-02-22 NOTE — ED Notes (Signed)
Patient discharged at this time. Ambulated to lobby with independent and steady gait. Breathing unlabored speaking in full sentences. Verbalized understanding of all discharge, follow up, and medication teaching. Discharged homed with all belongings.   

## 2022-02-22 NOTE — ED Provider Notes (Signed)
[facility] Provider Note    Event Date/Time   First MD Initiated Contact with Patient 02/22/22 0112     (approximate)   History   Flank Pain   HPI  LUCIUS WISE is a 31 y.o. male with history of previous kidney stones who presents to the emergency department left-sided flank pain and difficulty urinating that started today.  Has had some nausea.  No vomiting or diarrhea.  No hematuria, penile discharge, scrotal swelling or pain.   History provided by patient and significant other.    No past medical history on file.  Past Surgical History:  Procedure Laterality Date   SHOULDER ARTHROSCOPY      MEDICATIONS:  Prior to Admission medications   Medication Sig Start Date End Date Taking? Authorizing Provider  azithromycin (ZITHROMAX Z-PAK) 250 MG tablet Take 2 tablets (500 mg) on  Day 1,  followed by 1 tablet (250 mg) once daily on Days 2 through 5. 02/05/18   Sharman Cheek, MD    Physical Exam   Triage Vital Signs: ED Triage Vitals  Enc Vitals Group     BP 02/21/22 2110 126/83     Pulse Rate 02/21/22 2110 (!) 58     Resp 02/21/22 2110 16     Temp 02/21/22 2110 98.4 F (36.9 C)     Temp Source 02/21/22 2110 Oral     SpO2 02/21/22 2110 96 %     Weight 02/21/22 2111 175 lb (79.4 kg)     Height 02/21/22 2111 6' (1.829 m)     Head Circumference --      Peak Flow --      Pain Score 02/21/22 2111 10     Pain Loc --      Pain Edu? --      Excl. in [place]? --     Most recent vital signs: Vitals:   02/21/22 2110 02/22/22 0044  BP: 126/83 (!) 129/90  Pulse: (!) 58 (!) 50  Resp: 16   Temp: 98.4 F (36.9 C)   SpO2: 96% 99%    CONSTITUTIONAL: Alert and oriented and responds appropriately to questions. Well-appearing; well-nourished HEAD: Normocephalic, atraumatic EYES: Conjunctivae clear, pupils appear equal, sclera nonicteric ENT: normal nose; moist mucous membranes NECK: Supple, normal ROM CARD: RRR; S1 and S2 appreciated; no  murmurs, no clicks, no rubs, no gallops RESP: Normal chest excursion without splinting or tachypnea; breath sounds clear and equal bilaterally; no wheezes, no rhonchi, no rales, no hypoxia or respiratory distress, speaking full sentences ABD/GI: Normal bowel sounds; non-distended; soft, non-tender, no rebound, no guarding, no peritoneal signs BACK: The back appears normal EXT: Normal ROM in all joints; no deformity noted, no edema; no cyanosis SKIN: Normal color for age and race; warm; no rash on exposed skin NEURO: Moves all extremities equally, normal speech PSYCH: The patient's mood and manner are appropriate.   ED Results / Procedures / Treatments   LABS: (all labs ordered are listed, but only abnormal results are displayed) Labs Reviewed  URINALYSIS, ROUTINE W REFLEX MICROSCOPIC - Abnormal; Notable for the following components:      Result Value   Color, Urine YELLOW (*)    APPearance HAZY (*)    Hgb urine dipstick LARGE (*)    Protein, ur 30 (*)    All other components within normal limits  BASIC METABOLIC PANEL - Abnormal; Notable for the following components:   Potassium 3.3 (*)    Glucose, Bld 118 (*)  All other components within normal limits  CBC     EKG:     RADIOLOGY: My personal review and interpretation of imaging: CT scan shows nephrolithiasis.  I have personally reviewed all radiology reports.   CT Renal Stone Study  Result Date: 02/21/2022 CLINICAL DATA:  Flank pain, kidney stone suspected. Pt from home c/o left flank pain and painful urination since this afternoon. Prior hx kidney stones with similar presentation. Possibly voided a little bit of blood but is not sure. Just had a colonoscopy yesterday EXAM: CT ABDOMEN AND PELVIS WITHOUT CONTRAST TECHNIQUE: Multidetector CT imaging of the abdomen and pelvis was performed following the standard protocol without IV contrast. RADIATION DOSE REDUCTION: This exam was performed according to the departmental  dose-optimization program which includes automated exposure control, adjustment of the mA and/or kV according to patient size and/or use of iterative reconstruction technique. COMPARISON:  CT renal 06/28/2016 FINDINGS: Lower chest: No acute abnormality. Hepatobiliary: No focal liver abnormality. Status post cholecystectomy. No biliary dilatation. Pancreas: No focal lesion. Normal pancreatic contour. No surrounding inflammatory changes. No main pancreatic ductal dilatation. Spleen: Normal in size without focal abnormality. Adrenals/Urinary Tract: No adrenal nodule bilaterally. Left nephrolithiasis measuring up to 4 mm. No right nephrolithiasis. No ureterolithiasis bilaterally. No hydronephrosis or hydroureter. The urinary bladder is unremarkable. Stomach/Bowel: Stomach is within normal limits. No evidence of bowel wall thickening or dilatation. Unremarkable appendiceal stump. Vascular/Lymphatic: No abdominal aorta or iliac aneurysm. Mild atherosclerotic plaque of the aorta and its branches. No abdominal, pelvic, or inguinal lymphadenopathy. Reproductive: Prostate is unremarkable. Other: No intraperitoneal free fluid. No intraperitoneal free gas. No organized fluid collection. Musculoskeletal: No abdominal wall hernia or abnormality. No suspicious lytic or blastic osseous lesions. No acute displaced fracture. IMPRESSION: Nonobstructive left nephrolithiasis measuring up to the 4 mm. Electronically Signed   By: Tish Frederickson M.D.   On: 02/21/2022 21:54     PROCEDURES:  Critical Care performed: No      Procedures    IMPRESSION / MDM / ASSESSMENT AND PLAN / ED COURSE  I reviewed the triage vital signs and the nursing notes.    Patient here with left flank pain that felt similar to his previous kidney stones.     DIFFERENTIAL DIAGNOSIS (includes but not limited to):   Kidney stone, UTI, pyelonephritis, doubt diverticulitis, appendicitis, testicular torsion, epididymitis, orchitis   Patient's  presentation is most consistent with acute presentation with potential threat to life or bodily function.   PLAN: CBC, BMP, urine obtained from triage.  Also had a CT of his abdomen pelvis.  No leukocytosis.  Normal creatinine.  Urine does have some blood cells but no other sign of infection.  CT scan reviewed and interpreted by myself and the radiologist and shows that he has a stone in the left kidney but no ureterolithiasis or hydronephrosis.  He states he is feeling better but still has some intermittent pain.  No testicular pain or swelling, masses, skin changes to the scrotum, discharge.  I suspect that this probably was a kidney stone that may have passed or may be too small to be picked up on CT imaging.  Will discharge with pain medication, anti-inflammatories, Zofran.  Unable to give Flomax as he has an allergy to sulfa medications.  Will give urology follow-up.  Discussed return precautions.   MEDICATIONS GIVEN IN ED: Medications  fentaNYL (SUBLIMAZE) injection 50 mcg (has no administration in time range)  ondansetron (ZOFRAN) injection 4 mg (has no administration in time range)  oxyCODONE-acetaminophen (PERCOCET/ROXICET) 5-325 MG per tablet 1 tablet (1 tablet Oral Given 02/22/22 0047)  ketorolac (TORADOL) 30 MG/ML injection 30 mg (has no administration in time range)     ED COURSE:  At this time, I do not feel there is any life-threatening condition present. I reviewed all nursing notes, vitals, pertinent previous records.  All lab and urine results, EKGs, imaging ordered have been independently reviewed and interpreted by myself.  I reviewed all available radiology reports from any imaging ordered this visit.  Based on my assessment, I feel the patient is safe to be discharged home without further emergent workup and can continue workup as an outpatient as needed. Discussed all findings, treatment plan as well as usual and customary return precautions.  They verbalize understanding and are  comfortable with this plan.  Outpatient follow-up has been provided as needed.  All questions have been answered.    CONSULTS:  none   OUTSIDE RECORDS REVIEWED:  none       FINAL CLINICAL IMPRESSION(S) / ED DIAGNOSES   Final diagnoses:  Kidney stone     Rx / DC Orders   ED Discharge Orders          Ordered    oxyCODONE-acetaminophen (PERCOCET) 5-325 MG tablet  Every 6 hours PRN        02/22/22 0147    ibuprofen (ADVIL) 800 MG tablet  Every 8 hours PRN        02/22/22 0147    ondansetron (ZOFRAN-ODT) 4 MG disintegrating tablet  Every 6 hours PRN        02/22/22 0147             Note:  This document was prepared using Dragon voice recognition software and may include unintentional dictation errors.   Amillion Macchia, Delice Bison, DO 02/22/22 [phone]

## 2022-06-21 ENCOUNTER — Emergency Department
Admission: EM | Admit: 2022-06-21 | Discharge: 2022-06-21 | Disposition: A | Discharge status: Home / Self Care | Payer: BC Managed Care – PPO | Attending: Emergency Medicine | Admitting: Emergency Medicine

## 2022-06-21 ENCOUNTER — Emergency Department: Payer: BC Managed Care – PPO

## 2022-06-21 ENCOUNTER — Other Ambulatory Visit: Payer: Self-pay

## 2022-06-21 DIAGNOSIS — R1084 Generalized abdominal pain: Secondary | ICD-10-CM | POA: Insufficient documentation

## 2022-06-21 DIAGNOSIS — K5900 Constipation, unspecified: Secondary | ICD-10-CM | POA: Diagnosis not present

## 2022-06-21 DIAGNOSIS — K529 Noninfective gastroenteritis and colitis, unspecified: Secondary | ICD-10-CM

## 2022-06-21 LAB — URINALYSIS, ROUTINE W REFLEX MICROSCOPIC
Bilirubin Urine: NEGATIVE
Glucose, UA: NEGATIVE mg/dL
Hgb urine dipstick: NEGATIVE
Ketones, ur: NEGATIVE mg/dL
Leukocytes,Ua: NEGATIVE
Nitrite: NEGATIVE
Protein, ur: NEGATIVE mg/dL
Specific Gravity, Urine: 1.023 (ref 1.005–1.030)
pH: 6 (ref 5.0–8.0)

## 2022-06-21 LAB — COMPREHENSIVE METABOLIC PANEL
ALT: 17 U/L (ref 0–44)
AST: 23 U/L (ref 15–41)
Albumin: 4.2 g/dL (ref 3.5–5.0)
Alkaline Phosphatase: 58 U/L (ref 38–126)
Anion gap: 7 (ref 5–15)
BUN: 16 mg/dL (ref 6–20)
CO2: 28 mmol/L (ref 22–32)
Calcium: 9.1 mg/dL (ref 8.9–10.3)
Chloride: 101 mmol/L (ref 98–111)
Creatinine, Ser: 0.99 mg/dL (ref 0.61–1.24)
GFR, Estimated: >60 mL/min (ref >60)
Glucose, Bld: 157 mg/dL — ABNORMAL HIGH (ref 70–99)
Potassium: 3.7 mmol/L (ref 3.5–5.1)
Sodium: 136 mmol/L (ref 135–145)
Total Bilirubin: 0.9 mg/dL (ref 0.3–1.2)
Total Protein: 7.5 g/dL (ref 6.5–8.1)

## 2022-06-21 LAB — CBC WITH DIFFERENTIAL/PLATELET
Abs Immature Granulocytes: 0.05 10*3/uL (ref 0.00–0.07)
Basophils Absolute: 0.1 10*3/uL (ref 0.0–0.1)
Basophils Relative: 1 %
Eosinophils Absolute: 0.1 10*3/uL (ref 0.0–0.5)
Eosinophils Relative: 1 %
HCT: 42.7 % (ref 39.0–52.0)
Hemoglobin: 14.5 g/dL (ref 13.0–17.0)
Immature Granulocytes: 0 %
Lymphocytes Relative: 13 %
Lymphs Abs: 1.6 10*3/uL (ref 0.7–4.0)
MCH: 30.4 pg (ref 26.0–34.0)
MCHC: 34 g/dL (ref 30.0–36.0)
MCV: 89.5 fL (ref 80.0–100.0)
Monocytes Absolute: 0.6 10*3/uL (ref 0.1–1.0)
Monocytes Relative: 5 %
Neutro Abs: 10.1 10*3/uL — ABNORMAL HIGH (ref 1.7–7.7)
Neutrophils Relative %: 80 %
Platelets: 311 10*3/uL (ref 150–400)
RBC: 4.77 MIL/uL (ref 4.22–5.81)
RDW: 11.9 % (ref 11.5–15.5)
WBC: 12.4 10*3/uL — ABNORMAL HIGH (ref 4.0–10.5)
nRBC: 0 % (ref 0.0–0.2)

## 2022-06-21 LAB — LIPASE, BLOOD: Lipase: 60 U/L — ABNORMAL HIGH (ref 11–51)

## 2022-06-21 MED ORDER — IOHEXOL 300 MG/ML  SOLN
100.0000 mL | Freq: Once | INTRAMUSCULAR | Status: AC | PRN
  Administered 2022-06-21: 100 mL via INTRAVENOUS

## 2022-06-21 MED ORDER — ONDANSETRON HCL 4 MG/2ML IJ SOLN
4.0000 mg | Freq: Once | INTRAMUSCULAR | Status: AC
  Administered 2022-06-21: 4 mg via INTRAVENOUS
  Filled 2022-06-21: qty 2

## 2022-06-21 MED ORDER — DICYCLOMINE HCL 20 MG PO TABS: 20.0000 mg | ORAL_TABLET | Freq: Four times a day (QID) | ORAL | 0 refills | Status: AC

## 2022-06-21 MED ORDER — AMOXICILLIN-POT CLAVULANATE 875-125 MG PO TABS: 1.0000 | ORAL_TABLET | Freq: Two times a day (BID) | ORAL | 0 refills | Status: AC

## 2022-06-21 MED ORDER — ONDANSETRON 4 MG PO TBDP: 4.0000 mg | ORAL_TABLET | Freq: Three times a day (TID) | ORAL | 0 refills | Status: AC | PRN

## 2022-06-21 MED ORDER — SODIUM CHLORIDE 0.9 % IV BOLUS
1000.0000 mL | Freq: Once | INTRAVENOUS | Status: AC
  Administered 2022-06-21: 1000 mL via INTRAVENOUS

## 2022-06-21 MED ORDER — MORPHINE SULFATE (PF) 2 MG/ML IV SOLN
2.0000 mg | Freq: Once | INTRAVENOUS | Status: AC
  Administered 2022-06-21: 2 mg via INTRAVENOUS
  Filled 2022-06-21: qty 1

## 2022-06-21 MED ORDER — OXYCODONE-ACETAMINOPHEN 5-325 MG PO TABS: 1.0000 | ORAL_TABLET | ORAL | 0 refills | Status: AC | PRN

## 2022-06-21 MED ORDER — PIPERACILLIN-TAZOBACTAM 3.375 G IVPB 30 MIN
3.3750 g | Freq: Once | INTRAVENOUS | Status: AC
  Administered 2022-06-21: 3.375 g via INTRAVENOUS
  Filled 2022-06-21: qty 50

## 2022-06-21 NOTE — ED Notes (Signed)
Pt states he has urge to have a BM. Pt taken to bathroom at this time by spouse.

## 2022-06-21 NOTE — Discharge Instructions (Signed)
1.  Take antibiotic as prescribed (Augmentin 875 mg twice daily x 7 days). 2.  You may take Bentyl as needed for abdominal discomfort. 3.  You may take Percocet and Zofran as needed for more severe abdominal pain and nausea.  Start with 1/2 tablet Percocet as this medicine may be constipating. 4.  Return to the ER for worsening symptoms, persistent vomiting, difficulty breathing or other concerns.

## 2022-06-21 NOTE — ED Triage Notes (Signed)
First nurse note- To triage via [facility]. Pt has hx of IBS. C/o constipation. Last BM on Friday. Just started new medication, Trulance, tonight for IBS.

## 2022-06-21 NOTE — ED Provider Notes (Signed)
[facility] Provider Note    Event Date/Time   First MD Initiated Contact with Patient 06/21/22 0451     (approximate)   History   Constipation   HPI  Nicholas Chase is a 32 y.o. male brought to the ED via EMS from home with a chief complaint of abdominal pain and constipation.  Patient with a history of IBS-C, seeing GI in [facility].  He was prescribed Trulance last month but only took the first dose tonight.  Not had a BM x 2 to 3 days.  Endorses generalized abdominal pain with nausea after taking Trulance.  After patient was in the emergency department, he has had several bowel movements now which are liquidy.  Denies recent fever/chills, chest pain, shortness of breath, vomiting or dysuria.     Past Medical History  History reviewed. No pertinent past medical history.   Active Problem List  There are no problems to display for this patient.    Past Surgical History   Past Surgical History:  Procedure Laterality Date   SHOULDER ARTHROSCOPY       Home Medications   Prior to Admission medications   Medication Sig Start Date End Date Taking? Authorizing Provider  azithromycin (ZITHROMAX Z-PAK) 250 MG tablet Take 2 tablets (500 mg) on  Day 1,  followed by 1 tablet (250 mg) once daily on Days 2 through 5. 02/05/18   Carrie Mew, MD  ibuprofen (ADVIL) 800 MG tablet Take 1 tablet (800 mg total) by mouth every 8 (eight) hours as needed. 02/22/22   Ward, Delice Bison, DO  ondansetron (ZOFRAN-ODT) 4 MG disintegrating tablet Take 1 tablet (4 mg total) by mouth every 6 (six) hours as needed for nausea or vomiting. 02/22/22   Ward, Delice Bison, DO  oxyCODONE-acetaminophen (PERCOCET) 5-325 MG tablet Take 2 tablets by mouth every 6 (six) hours as needed for severe pain. 02/22/22 02/22/23  Ward, Delice Bison, DO     Allergies  Sulfa antibiotics   Family History  History reviewed. No pertinent family history.   Physical Exam  Triage Vital Signs: ED  Triage Vitals  Enc Vitals Group     BP 06/21/22 0312 132/80     Pulse Rate 06/21/22 0312 64     Resp 06/21/22 0312 18     Temp 06/21/22 0312 97.6 F (36.4 C)     Temp Source 06/21/22 0312 Oral     SpO2 06/21/22 0312 96 %     Weight 06/21/22 0312 180 lb (81.6 kg)     Height 06/21/22 0312 6' (1.829 m)     Head Circumference --      Peak Flow --      Pain Score 06/21/22 0321 6     Pain Loc --      Pain Edu? --      Excl. in [place]? --     Updated Vital Signs: BP 135/81   Pulse (!) 50   Temp 97.6 F (36.4 C) (Oral)   Resp 13   Ht 6' (1.829 m)   Wt 81.6 kg   SpO2 99%   BMI 24.41 kg/m    General: Awake, mild to moderate distress.  CV:  RRR.  Good peripheral perfusion.  Resp:  Normal effort.  CTAB. Abd:  Mild diffuse tenderness to palpation without rebound or guarding.  No distention.  Other:  No truncal vesicles.   ED Results / Procedures / Treatments  Labs (all labs ordered are listed, but only  abnormal results are displayed) Labs Reviewed  COMPREHENSIVE METABOLIC PANEL - Abnormal; Notable for the following components:      Result Value   Glucose, Bld 157 (*)    All other components within normal limits  LIPASE, BLOOD - Abnormal; Notable for the following components:   Lipase 60 (*)    All other components within normal limits  CBC WITH DIFFERENTIAL/PLATELET - Abnormal; Notable for the following components:   WBC 12.4 (*)    Neutro Abs 10.1 (*)    All other components within normal limits  URINALYSIS, ROUTINE W REFLEX MICROSCOPIC - Abnormal; Notable for the following components:   Color, Urine AMBER (*)    APPearance HAZY (*)    All other components within normal limits     EKG  None   RADIOLOGY I have independently visualized and interpreted patient's x-ray and CT scan as well as noted the radiology interpretation:  KUB: No acute; moderate stool burden noted superiorly  CT abdomen/pelvis: Enteritis versus colitis  Official radiology report(s): CT  Abdomen Pelvis W Contrast  Result Date: 06/21/2022 CLINICAL DATA:  Acute abdominal pain EXAM: CT ABDOMEN AND PELVIS WITH CONTRAST TECHNIQUE: Multidetector CT imaging of the abdomen and pelvis was performed using the standard protocol following bolus administration of intravenous contrast. RADIATION DOSE REDUCTION: This exam was performed according to the departmental dose-optimization program which includes automated exposure control, adjustment of the mA and/or kV according to patient size and/or use of iterative reconstruction technique. CONTRAST:  124mL OMNIPAQUE IOHEXOL 300 MG/ML  SOLN COMPARISON:  02/21/2022 FINDINGS: Lower chest: No acute abnormality. Hepatobiliary: No focal liver abnormality is seen. No gallstones, gallbladder wall thickening, or biliary dilatation. Pancreas: Unremarkable. No pancreatic ductal dilatation or surrounding inflammatory changes. Spleen: Normal in size without focal abnormality. Adrenals/Urinary Tract: Normal adrenal glands. Nonobstructing stone within inferior pole of the left kidney measures 4 mm. No mass or hydronephrosis. Urinary bladder is unremarkable. Stomach/Bowel: Stomach appears normal. The appendix is visualized and appears normal. No pathologic dilatation of the large or small bowel loops to suggest obstruction. There are a few loops of small bowel within the right abdomen which exhibit mild wall thickening without surrounding inflammatory fat stranding. Mild low attenuation edema involving the left colon is noted. Vascular/Lymphatic: No significant vascular findings are present. No enlarged abdominal or pelvic lymph nodes. Reproductive: Prostate is unremarkable. Other: No free fluid or fluid collections. No signs of pneumoperitoneum. Musculoskeletal: No acute or significant osseous findings. IMPRESSION: 1. There are a few loops of small bowel within the right abdomen which exhibit mild wall thickening without surrounding inflammatory fat stranding. Mild low  attenuation edema involving the left colon is noted. Findings are nonspecific but may reflect mild inflammatory or infectious enteritis/colitis. 2. Nonobstructing left renal calculus. Electronically Signed   By: Kerby Moors M.D.   On: 06/21/2022 06:25   DG Abd 1 View  Result Date: 06/21/2022 CLINICAL DATA:  No bowel movement for 2 days EXAM: ABDOMEN - 1 VIEW COMPARISON:  02/21/2022 FINDINGS: Scattered large and small bowel gas is noted. No free air seen. No significant retained fecal material is noted to suggest constipation. No bony abnormality is seen. IMPRESSION: No acute abnormality noted. Electronically Signed   By: Inez Catalina M.D.   On: 06/21/2022 03:59     PROCEDURES:  Critical Care performed: No  .1-3 Lead EKG Interpretation  Performed by: Paulette Blanch, MD Authorized by: Paulette Blanch, MD     Interpretation: normal     ECG rate:  60   ECG rate assessment: normal     Rhythm: sinus rhythm     Ectopy: none     Conduction: normal   Comments:     Patient placed on cardiac monitor to evaluate for arrhythmias    MEDICATIONS ORDERED IN ED: Medications  sodium chloride 0.9 % bolus 1,000 mL (1,000 mLs Intravenous New Bag/Given 06/21/22 0514)  ondansetron (ZOFRAN) injection 4 mg (4 mg Intravenous Given 06/21/22 0513)  morphine (PF) 2 MG/ML injection 2 mg (2 mg Intravenous Given 06/21/22 0514)  iohexol (OMNIPAQUE) 300 MG/ML solution 100 mL (100 mLs Intravenous Contrast Given 06/21/22 0528)     IMPRESSION / MDM / ASSESSMENT AND PLAN / ED COURSE  I reviewed the triage vital signs and the nursing notes.                             32 year old male presenting with abdominal pain and constipation.Differential diagnosis includes, but is not limited to, acute appendicitis, renal colic, testicular torsion, urinary tract infection/pyelonephritis, prostatitis,  epididymitis, diverticulitis, small bowel obstruction or ileus, colitis, abdominal aortic aneurysm, gastroenteritis, hernia, etc. I  have personally reviewed patient's records and note last ED visit in September 2023 for kidney stone.  Fortunately not able to see his GI physicians notes.  Family member tells me patient has had workup including ultrasound, lab work, colonoscopy.  Has yet to have CT scan.  Patient's presentation is most consistent with acute presentation with potential threat to life or bodily function.  The patient is on the cardiac monitor to evaluate for evidence of arrhythmia and/or significant heart rate changes.  Laboratory results demonstrate mild leukocytosis WBC 12.4, minimally elevated lipase of 60, negative UA.  KUB demonstrates moderate stool burden superiorly.  Given degree of patient's pain, will obtain CT abdomen/pelvis.  Initiate IV fluid hydration, IV morphine for pain.  With IV Zofran for nausea.  Will reassess.  Clinical Course as of 06/21/22 0635  Mon Jun 21, 2022  3329 Patient improved.  Updated patient and family member on CT imaging result.  Will administer IV Zosyn before discharge.  Will discharge patient home on Augmentin, as needed Zofran/Percocet/Bentyl and he will follow-up closely with his GI doctor.  Strict return precautions given.  Both verbalized understanding and agree with plan of care. [JS]    Clinical Course User Index [JS] Irean Hong, MD     FINAL CLINICAL IMPRESSION(S) / ED DIAGNOSES   Final diagnoses:  Generalized abdominal pain  Constipation, unspecified constipation type     Rx / DC Orders   ED Discharge Orders     None        Note:  This document was prepared using Dragon voice recognition software and may include unintentional dictation errors.   Irean Hong, MD 06/21/22 [phone]

## 2022-06-21 NOTE — ED Triage Notes (Signed)
Pt complaining of not having a bowel movement since Friday. Just had a bowel movement that was moderate. Just started a new medication called trualance.
